# Patient Record
Sex: Male | Born: 1990 | Race: White | Hispanic: No | Marital: Single | Smoking: Never smoker
Health system: Southern US, Community
[De-identification: ages and names within clinical notes are randomized; demographics above are authoritative.]

---

## 2013-06-19 ENCOUNTER — Ambulatory Visit (INDEPENDENT_AMBULATORY_CARE_PROVIDER_SITE_OTHER): Payer: Commercial Managed Care - PPO | Admitting: Family Medicine

## 2013-06-19 ENCOUNTER — Encounter (INDEPENDENT_AMBULATORY_CARE_PROVIDER_SITE_OTHER): Payer: Self-pay

## 2013-06-19 VITALS — BP 125/75 | HR 86 | Temp 96.8°F | Ht 71.0 in | Wt 180.0 lb

## 2013-06-19 DIAGNOSIS — H60399 Other infective otitis externa, unspecified ear: Secondary | ICD-10-CM

## 2013-06-19 DIAGNOSIS — H60391 Other infective otitis externa, right ear: Secondary | ICD-10-CM

## 2013-06-19 MED ORDER — NEOMYCIN-POLYMYXIN-HC 3.5-10000-1 OT SOLN: 4.0000 [drp] | Freq: Four times a day (QID) | OTIC | Status: DC

## 2013-06-19 MED ORDER — AMOXICILLIN 875 MG PO TABS: 875.0000 mg | ORAL_TABLET | Freq: Two times a day (BID) | ORAL | Status: DC

## 2013-06-19 NOTE — Patient Instructions (Signed)
Otitis Externa Otitis externa is a bacterial or fungal infection of the outer ear canal. This is the area from the eardrum to the outside of the ear. Otitis externa is sometimes called "swimmer's ear." CAUSES  Possible causes of infection include:  Swimming in dirty water.  Moisture remaining in the ear after swimming or bathing.  Mild injury (trauma) to the ear.  Objects stuck in the ear (foreign body).  Cuts or scrapes (abrasions) on the outside of the ear. SYMPTOMS  The first symptom of infection is often itching in the ear canal. Later signs and symptoms may include swelling and redness of the ear canal, ear pain, and yellowish-white fluid (pus) coming from the ear. The ear pain may be worse when pulling on the earlobe. DIAGNOSIS  Your caregiver will perform a physical exam. A sample of fluid may be taken from the ear and examined for bacteria or fungi. TREATMENT  Antibiotic ear drops are often given for 10 to 14 days. Treatment may also include pain medicine or corticosteroids to reduce itching and swelling. PREVENTION   Keep your ear dry. Use the corner of a towel to absorb water out of the ear canal after swimming or bathing.  Avoid scratching or putting objects inside your ear. This can damage the ear canal or remove the protective wax that lines the canal. This makes it easier for bacteria and fungi to grow.  Avoid swimming in lakes, polluted water, or poorly chlorinated pools.  You may use ear drops made of rubbing alcohol and vinegar after swimming. Combine equal parts of white vinegar and alcohol in a bottle. Put 3 or 4 drops into each ear after swimming. HOME CARE INSTRUCTIONS   Apply antibiotic ear drops to the ear canal as prescribed by your caregiver.  Only take over-the-counter or prescription medicines for pain, discomfort, or fever as directed by your caregiver.  If you have diabetes, follow any additional treatment instructions from your caregiver.  Keep all  follow-up appointments as directed by your caregiver. SEEK MEDICAL CARE IF:   You have a fever.  Your ear is still red, swollen, painful, or draining pus after 3 days.  Your redness, swelling, or pain gets worse.  You have a severe headache.  You have redness, swelling, pain, or tenderness in the area behind your ear. MAKE SURE YOU:   Understand these instructions.  Will watch your condition.  Will get help right away if you are not doing well or get worse. Document Released: 07/19/2005 Document Revised: 10/11/2011 Document Reviewed: 08/05/2011 Exit[employer] Patient Information 2014 [employer].  

## 2013-06-19 NOTE — Progress Notes (Signed)
  Subjective:    Patient ID: Richard Shepard, male    DOB: [birth year], 22 y.o.   MRN: [id number]  HPI This 22 y.o. male presents for evaluation of right ear discomfort for 2 days.  He c/o decreased Hearing acuity right ear.   Review of Systems C/o right otalgia and decreased hearing acuity. No chest pain, SOB, HA, dizziness, vision change, N/V, diarrhea, constipation, dysuria, urinary urgency or frequency, myalgias, arthralgias or rash.     Objective:   Physical Exam Vital signs noted  Well developed well nourished male.  HEENT - Head atraumatic Normocephalic                Eyes - PERRLA, Conjuctiva - clear Sclera- Clear EOMI                Ears - EAC's bilateral cerumen impactions  Gross Hearing decreased Right ear.                           After irrigation of right ear the EAC is decreased and TM unable to be visualized.                Throat - oropharanx wnl Respiratory - Lungs CTA bilateral Cardiac - RRR S1 and S2 without murmur.  Attempted to remove wax from right ear with a wick and then had to stop due to discomfort and cerumen impaction remains.     Assessment & Plan:  Otitis, externa, infective, right - Plan: neomycin-polymyxin-hydrocortisone (CORTISPORIN) otic solution, amoxicillin (AMOXIL) 875 MG tablet po bid x 10 days.  Deatra Canter FNP

## 2013-06-20 ENCOUNTER — Ambulatory Visit: Payer: Self-pay | Admitting: Family Medicine

## 2013-10-11 ENCOUNTER — Encounter: Payer: Self-pay | Admitting: Nurse Practitioner

## 2013-10-11 ENCOUNTER — Ambulatory Visit (INDEPENDENT_AMBULATORY_CARE_PROVIDER_SITE_OTHER): Payer: Commercial Managed Care - PPO | Admitting: Nurse Practitioner

## 2013-10-11 VITALS — BP 113/70 | HR 96 | Temp 100.9°F | Ht 70.0 in | Wt 178.0 lb

## 2013-10-11 DIAGNOSIS — J101 Influenza due to other identified influenza virus with other respiratory manifestations: Secondary | ICD-10-CM

## 2013-10-11 DIAGNOSIS — J111 Influenza due to unidentified influenza virus with other respiratory manifestations: Secondary | ICD-10-CM

## 2013-10-11 DIAGNOSIS — J029 Acute pharyngitis, unspecified: Secondary | ICD-10-CM

## 2013-10-11 LAB — POCT RAPID STREP A (OFFICE): Rapid Strep A Screen: NEGATIVE

## 2013-10-11 LAB — POCT INFLUENZA A/B
INFLUENZA A, POC: POSITIVE
Influenza B, POC: NEGATIVE

## 2013-10-11 MED ORDER — OSELTAMIVIR PHOSPHATE 75 MG PO CAPS: 75.0000 mg | ORAL_CAPSULE | Freq: Every day | ORAL | Status: DC

## 2013-10-11 NOTE — Patient Instructions (Signed)

## 2013-10-11 NOTE — Progress Notes (Signed)
   Subjective:    Patient ID: Richard Shepard, male    DOB: March 18, 1991, 23 y.o.   MRN: [id number]  HPI  Patient presents today complaining of cough, fever, body aches, chills, sore throat, headache, fatigue, and sinus pressure x 3 days. Patient has not taken any medications for his symptoms. Denies any sick contacts.   Review of Systems  Constitutional: Positive for fever, chills and fatigue.  HENT: Positive for congestion, ear pain, rhinorrhea, sinus pressure and sore throat.   Respiratory: Positive for cough. Negative for shortness of breath.   Cardiovascular: Negative for chest pain.  All other systems reviewed and are negative.       Objective:   Physical Exam  Constitutional: He is oriented to person, place, and time. He appears well-developed and well-nourished.  HENT:  Head: Normocephalic.  Right Ear: External ear normal.  Left Ear: External ear normal.  Nose: Right sinus exhibits maxillary sinus tenderness. Left sinus exhibits maxillary sinus tenderness.  Mouth/Throat: Posterior oropharyngeal erythema present.  Neck: Normal range of motion. Neck supple.  Cardiovascular: Normal rate, regular rhythm and normal heart sounds.   Pulmonary/Chest: Effort normal and breath sounds normal. He has no wheezes.  Lymphadenopathy:    He has no cervical adenopathy.  Neurological: He is alert and oriented to person, place, and time.  Skin: Skin is warm and dry.  Psychiatric: He has a normal mood and affect. His behavior is normal. Judgment and thought content normal.    BP 113/70  Pulse 96  Temp(Src) 100.9 F (38.3 C) (Oral)  Ht 5\' 10"  (1.778 m)  Wt 178 lb (80.74 kg)  BMI 25.54 kg/m2  Results for orders placed in visit on 10/11/13  POCT INFLUENZA A/B      Result Value Ref Range   Influenza A, POC Positive     Influenza B, POC Negative          Assessment & Plan:   1. Sore throat   2. Influenza A    Orders Placed This Encounter  Procedures  . POCT Influenza A/B  .  POCT rapid strep A   Meds ordered this encounter  Medications  . oseltamivir (TAMIFLU) 75 MG capsule    Sig: Take 1 capsule (75 mg total) by mouth daily.    Dispense:  10 capsule    Refill:  0    Order Specific Question:  Supervising Provider    Answer:  Ernestina PennaMOORE, DONALD W [[id number]]   Rest Push fluids Tylenol as needed for fever and body aches  Mary-Margaret Daphine DeutscherMartin, FNP

## 2014-03-18 ENCOUNTER — Encounter: Payer: Self-pay | Admitting: Family

## 2014-03-18 ENCOUNTER — Ambulatory Visit (INDEPENDENT_AMBULATORY_CARE_PROVIDER_SITE_OTHER): Payer: Commercial Managed Care - PPO | Admitting: Family

## 2014-03-18 VITALS — BP 120/60 | HR 85 | Temp 97.8°F | Ht 70.5 in | Wt 171.0 lb

## 2014-03-18 DIAGNOSIS — H6123 Impacted cerumen, bilateral: Secondary | ICD-10-CM

## 2014-03-18 DIAGNOSIS — H6120 Impacted cerumen, unspecified ear: Secondary | ICD-10-CM

## 2014-03-18 NOTE — Patient Instructions (Signed)
Cerumen Impaction °A cerumen impaction is when the wax in your ear forms a plug. This plug usually causes reduced hearing. Sometimes it also causes an earache or dizziness. Removing a cerumen impaction can be difficult and painful. The wax sticks to the ear canal. The canal is sensitive and bleeds easily. If you try to remove a heavy wax buildup with a cotton tipped swab, you may push it in further. °Irrigation with water, suction, and small ear curettes may be used to clear out the wax. If the impaction is fixed to the skin in the ear canal, ear drops may be needed for a few days to loosen the wax. People who build up a lot of wax frequently can use ear wax removal products available in your local drugstore. °SEEK MEDICAL CARE IF:  °You develop an earache, increased hearing loss, or marked dizziness. °Document Released: 08/26/2004 Document Revised: 10/11/2011 Document Reviewed: 10/16/2009 °ExitCare® Patient Information ©2015 [employer]. This information is not intended to replace advice given to you by your health care provider. Make sure you discuss any questions you have with your health care provider. ° °

## 2014-03-18 NOTE — Progress Notes (Signed)
   Subjective:    Patient ID: Richard Shepard, male    DOB: 06-29-1991, 23 y.o.   MRN: [id number]  Ear Fullness  There is pain in the left ear. This is a new problem. The current episode started in the past 7 days. The problem occurs constantly. The problem has been gradually worsening. There has been no fever. The pain is at a severity of 2/10. The pain is mild. Associated symptoms include hearing loss. Pertinent negatives include no abdominal pain, coughing, diarrhea, ear discharge, headaches, neck pain, rhinorrhea, sore throat or vomiting. He has tried ear drops for the symptoms. The treatment provided mild relief.      Review of Systems  Constitutional: Negative.   HENT: Positive for hearing loss. Negative for ear discharge, rhinorrhea and sore throat.   Respiratory: Negative.  Negative for cough.   Cardiovascular: Negative.   Gastrointestinal: Negative.  Negative for vomiting, abdominal pain and diarrhea.  Endocrine: Negative.   Genitourinary: Negative.   Musculoskeletal: Negative.  Negative for neck pain.  Neurological: Negative.  Negative for headaches.  Hematological: Negative.   Psychiatric/Behavioral: Negative.   All other systems reviewed and are negative.      Objective:   Physical Exam  Vitals reviewed. Constitutional: He is oriented to person, place, and time. He appears well-developed and well-nourished. No distress.  HENT:  Head: Normocephalic.  Right Ear: External ear normal.  Left Ear: External ear normal.  Nose: Nose normal.  Mouth/Throat: Oropharynx is clear and moist.  Eyes: Pupils are equal, round, and reactive to light. Right eye exhibits no discharge. Left eye exhibits no discharge.  Neck: Normal range of motion. Neck supple. No thyromegaly present.  Cardiovascular: Normal rate, regular rhythm, normal heart sounds and intact distal pulses.   No murmur heard. Pulmonary/Chest: Effort normal and breath sounds normal. No respiratory distress. He has no  wheezes.  Abdominal: Soft. Bowel sounds are normal. He exhibits no distension. There is no tenderness.  Musculoskeletal: Normal range of motion. He exhibits no edema and no tenderness.  Neurological: He is alert and oriented to person, place, and time. He has normal reflexes. No cranial nerve deficit.  Skin: Skin is warm and dry. No rash noted. No erythema.  Psychiatric: He has a normal mood and affect. His behavior is normal. Judgment and thought content normal.      BP 120/60  Pulse 85  Temp(Src) 97.8 F (36.6 C) (Oral)  Ht 5' 10.5" (1.791 m)  Wt 171 lb (77.565 kg)  BMI 24.18 kg/m2     Assessment & Plan:  1. Cerumen impaction, bilateral -Keep ears clean and dry -Do not place anything into ears -RTO if ear fullness or pain returns  Jannifer Rodneyhristy Artice Bergerson, FNP

## 2014-07-12 ENCOUNTER — Ambulatory Visit (INDEPENDENT_AMBULATORY_CARE_PROVIDER_SITE_OTHER): Payer: Commercial Managed Care - PPO | Admitting: Family Medicine

## 2014-07-12 ENCOUNTER — Encounter: Payer: Self-pay | Admitting: Family Medicine

## 2014-07-12 VITALS — BP 103/71 | HR 97 | Temp 98.8°F | Ht 70.5 in | Wt 176.4 lb

## 2014-07-12 DIAGNOSIS — J206 Acute bronchitis due to rhinovirus: Secondary | ICD-10-CM

## 2014-07-12 MED ORDER — ALBUTEROL SULFATE HFA 108 (90 BASE) MCG/ACT IN AERS: 2.0000 | INHALATION_SPRAY | Freq: Four times a day (QID) | RESPIRATORY_TRACT | Status: DC | PRN

## 2014-07-12 MED ORDER — AZITHROMYCIN 250 MG PO TABS
ORAL_TABLET | ORAL | Status: DC
Start: 2014-07-12 — End: 2015-09-22

## 2014-07-12 MED ORDER — HYDROCODONE-HOMATROPINE 5-1.5 MG/5ML PO SYRP: 5.0000 mL | ORAL_SOLUTION | Freq: Three times a day (TID) | ORAL | Status: DC | PRN

## 2014-07-12 MED ORDER — METHYLPREDNISOLONE ACETATE 80 MG/ML IJ SUSP
80.0000 mg | Freq: Once | INTRAMUSCULAR | Status: AC
  Administered 2014-07-12: 80 mg via INTRAMUSCULAR

## 2014-07-12 NOTE — Progress Notes (Signed)
   Subjective:    Patient ID: Rupert StacksJoseph M Turpin, male    DOB: 1990/09/26, 23 y.o.   MRN: [id number]  HPI C/o cough and uri sx's  Review of Systems  Constitutional: Positive for fatigue. Negative for fever.  HENT: Negative for ear pain.   Eyes: Negative for discharge.  Respiratory: Positive for cough.   Cardiovascular: Negative for chest pain.  Gastrointestinal: Negative for abdominal distention.  Endocrine: Negative for polyuria.  Genitourinary: Negative for difficulty urinating.  Musculoskeletal: Negative for gait problem and neck pain.  Skin: Negative for color change and rash.  Neurological: Negative for speech difficulty and headaches.  Psychiatric/Behavioral: Negative for agitation.       Objective:    BP 103/71 mmHg  Pulse 97  Temp(Src) 98.8 F (37.1 C) (Oral)  Ht 5' 10.5" (1.791 m)  Wt 176 lb 6.4 oz (80.015 kg)  BMI 24.94 kg/m2 Physical Exam  Constitutional: He is oriented to person, place, and time. He appears well-developed and well-nourished.  HENT:  Head: Normocephalic and atraumatic.  Mouth/Throat: Oropharynx is clear and moist.  Eyes: Pupils are equal, round, and reactive to light.  Neck: Normal range of motion. Neck supple.  Cardiovascular: Normal rate and regular rhythm.   No murmur heard. Pulmonary/Chest: Effort normal. He has wheezes.  Abdominal: Soft. Bowel sounds are normal. There is no tenderness.  Neurological: He is alert and oriented to person, place, and time.  Skin: Skin is warm and dry.  Psychiatric: He has a normal mood and affect.          Assessment & Plan:     ICD-9-CM ICD-10-CM   1. Acute bronchitis due to Rhinovirus 466.0 J20.6 azithromycin (ZITHROMAX) 250 MG tablet   079.3  methylPREDNISolone acetate (DEPO-MEDROL) injection 80 mg     HYDROcodone-homatropine (HYCODAN) 5-1.5 MG/5ML syrup     albuterol (PROVENTIL HFA;VENTOLIN HFA) 108 (90 BASE) MCG/ACT inhaler     Return if symptoms worsen or fail to improve.  Deatra CanterWilliam J Jourdan Maldonado  FNP

## 2015-09-22 ENCOUNTER — Encounter: Payer: Self-pay | Admitting: Family Medicine

## 2015-09-22 ENCOUNTER — Ambulatory Visit (INDEPENDENT_AMBULATORY_CARE_PROVIDER_SITE_OTHER): Payer: BLUE CROSS/BLUE SHIELD | Admitting: Family Medicine

## 2015-09-22 VITALS — BP 138/83 | HR 106 | Temp 97.7°F | Ht 70.5 in | Wt 180.0 lb

## 2015-09-22 DIAGNOSIS — J209 Acute bronchitis, unspecified: Secondary | ICD-10-CM

## 2015-09-22 MED ORDER — AZITHROMYCIN 250 MG PO TABS: ORAL_TABLET | ORAL | Status: DC

## 2015-09-22 MED ORDER — ALBUTEROL SULFATE HFA 108 (90 BASE) MCG/ACT IN AERS: 2.0000 | INHALATION_SPRAY | Freq: Four times a day (QID) | RESPIRATORY_TRACT | Status: DC | PRN

## 2015-09-22 MED ORDER — BENZONATATE 200 MG PO CAPS: 200.0000 mg | ORAL_CAPSULE | Freq: Two times a day (BID) | ORAL | Status: DC | PRN

## 2015-09-22 MED ORDER — FLUTICASONE PROPIONATE 50 MCG/ACT NA SUSP: 1.0000 | Freq: Two times a day (BID) | NASAL | Status: DC | PRN

## 2015-09-22 NOTE — Progress Notes (Signed)
BP 138/83 mmHg  Pulse 106  Temp(Src) 97.7 F (36.5 C) (Oral)  Ht 5' 10.5" (1.791 m)  Wt 180 lb (81.647 kg)  BMI 25.45 kg/m2   Subjective:    Patient ID: Richard Shepard, male    DOB: June 18, 1991, 25 y.o.   MRN: [id number]  HPI: Richard Shepard is a 25 y.o. male presenting on 09/22/2015 for Cough   Cough Associated symptoms include postnasal drip, rhinorrhea and a sore throat. Pertinent negatives include no chest pain, chills, ear pain, eye redness, fever, headaches, rash, shortness of breath or wheezing.   Cough and congestion Patient comes in today complaining of cough and congestion and sinus pressure and postnasal drainage. His cough is mostly dry. He has tried cough drops and Coricidin spray which have not seemed to help much with cough. He denies any fevers or chills or shortness of breath or wheezing. He denies any sick contacts that he knows of.  Relevant past medical, surgical, family and social history reviewed and updated as indicated. Interim medical history since our last visit reviewed. Allergies and medications reviewed and updated.  Review of Systems  Constitutional: Negative for fever and chills.  HENT: Positive for congestion, postnasal drip, rhinorrhea, sinus pressure, sneezing and sore throat. Negative for ear discharge, ear pain and voice change.   Eyes: Negative for pain, discharge, redness and visual disturbance.  Respiratory: Positive for cough. Negative for chest tightness, shortness of breath and wheezing.   Cardiovascular: Negative for chest pain and leg swelling.  Gastrointestinal: Negative for abdominal pain, diarrhea and constipation.  Genitourinary: Negative for difficulty urinating.  Musculoskeletal: Negative for back pain and gait problem.  Skin: Negative for rash.  Neurological: Negative for syncope, light-headedness and headaches.  All other systems reviewed and are negative.   Per HPI unless specifically indicated above     Medication List         This list is accurate as of: 09/22/15 11:10 AM.  Always use your most recent med list.               albuterol 108 (90 Base) MCG/ACT inhaler  Commonly known as:  PROVENTIL HFA;VENTOLIN HFA  Inhale 2 puffs into the lungs every 6 (six) hours as needed for wheezing or shortness of breath.     azithromycin 250 MG tablet  Commonly known as:  ZITHROMAX  Take 2 the first day and then one each day after.     benzonatate 200 MG capsule  Commonly known as:  TESSALON  Take 1 capsule (200 mg total) by mouth 2 (two) times daily as needed for cough.     fluticasone 50 MCG/ACT nasal spray  Commonly known as:  FLONASE  Place 1 spray into both nostrils 2 (two) times daily as needed for allergies or rhinitis.           Objective:    BP 138/83 mmHg  Pulse 106  Temp(Src) 97.7 F (36.5 C) (Oral)  Ht 5' 10.5" (1.791 m)  Wt 180 lb (81.647 kg)  BMI 25.45 kg/m2  Wt Readings from Last 3 Encounters:  09/22/15 180 lb (81.647 kg)  07/12/14 176 lb 6.4 oz (80.015 kg)  03/18/14 171 lb (77.565 kg)    Physical Exam  Constitutional: He is oriented to person, place, and time. He appears well-developed and well-nourished. No distress.  HENT:  Right Ear: Tympanic membrane, external ear and ear canal normal.  Left Ear: Tympanic membrane, external ear and ear canal normal.  Nose: Mucosal edema  and rhinorrhea present. No sinus tenderness. No epistaxis. Right sinus exhibits maxillary sinus tenderness. Right sinus exhibits no frontal sinus tenderness. Left sinus exhibits maxillary sinus tenderness. Left sinus exhibits no frontal sinus tenderness.  Mouth/Throat: Uvula is midline and mucous membranes are normal. Posterior oropharyngeal edema and posterior oropharyngeal erythema present. No oropharyngeal exudate or tonsillar abscesses.  Eyes: Conjunctivae and EOM are normal. Pupils are equal, round, and reactive to light. Right eye exhibits no discharge. No scleral icterus.  Neck: Neck supple. No  thyromegaly present.  Cardiovascular: Normal rate, regular rhythm, normal heart sounds and intact distal pulses.   No murmur heard. Pulmonary/Chest: Effort normal and breath sounds normal. No respiratory distress. He has no wheezes.  Musculoskeletal: Normal range of motion. He exhibits no edema.  Lymphadenopathy:    He has no cervical adenopathy.  Neurological: He is alert and oriented to person, place, and time. Coordination normal.  Skin: Skin is warm and dry. No rash noted. He is not diaphoretic.  Psychiatric: He has a normal mood and affect. His behavior is normal.  Nursing note and vitals reviewed.     Assessment & Plan:   Problem List Items Addressed This Visit    None    Visit Diagnoses    Acute bronchitis, unspecified organism    -  Primary    Relevant Medications    albuterol (PROVENTIL HFA;VENTOLIN HFA) 108 (90 Base) MCG/ACT inhaler    fluticasone (FLONASE) 50 MCG/ACT nasal spray    azithromycin (ZITHROMAX) 250 MG tablet    benzonatate (TESSALON) 200 MG capsule       Follow up plan: Return if symptoms worsen or fail to improve.  Counseling provided for all of the vaccine components No orders of the defined types were placed in this encounter.    Arville Care, MD [facility] 09/22/2015, 11:10 AM

## 2016-12-11 ENCOUNTER — Encounter: Payer: Self-pay | Admitting: Nurse Practitioner

## 2016-12-11 ENCOUNTER — Ambulatory Visit (INDEPENDENT_AMBULATORY_CARE_PROVIDER_SITE_OTHER): Payer: BLUE CROSS/BLUE SHIELD | Admitting: Nurse Practitioner

## 2016-12-11 VITALS — BP 110/75 | HR 81 | Temp 97.1°F | Resp 18 | Ht 70.5 in | Wt 170.2 lb

## 2016-12-11 DIAGNOSIS — J209 Acute bronchitis, unspecified: Secondary | ICD-10-CM | POA: Diagnosis not present

## 2016-12-11 MED ORDER — PREDNISONE 20 MG PO TABS: ORAL_TABLET | ORAL | 0 refills | Status: DC

## 2016-12-11 MED ORDER — BENZONATATE 100 MG PO CAPS: 100.0000 mg | ORAL_CAPSULE | Freq: Three times a day (TID) | ORAL | 0 refills | Status: DC | PRN

## 2016-12-11 MED ORDER — HYDROCODONE-HOMATROPINE 5-1.5 MG/5ML PO SYRP: 5.0000 mL | ORAL_SOLUTION | Freq: Four times a day (QID) | ORAL | 0 refills | Status: DC | PRN

## 2016-12-11 MED ORDER — AZITHROMYCIN 250 MG PO TABS: ORAL_TABLET | ORAL | 0 refills | Status: DC

## 2016-12-11 NOTE — Patient Instructions (Signed)

## 2016-12-11 NOTE — Progress Notes (Signed)
Subjective:    Patient ID: Richard Shepard, male    DOB: July 27, 1991, 26 y.o.   MRN: [id number]  HPI Patient comes inc/o cough that started 2 weeks. Chest is sore from coughing. Cough has become productive and is yellowish green in color. Denies fever.    Review of Systems  Constitutional: Positive for chills. Negative for fever.  HENT: Positive for congestion, rhinorrhea and sore throat. Negative for ear pain, sinus pain, sinus pressure and trouble swallowing.   Eyes: Negative.   Respiratory: Positive for cough. Negative for shortness of breath.   Cardiovascular: Negative.   Gastrointestinal: Negative.   Genitourinary: Negative.   Neurological: Negative.   Psychiatric/Behavioral: Negative.   All other systems reviewed and are negative.      Objective:   Physical Exam  Constitutional: He is oriented to person, place, and time. He appears well-developed and well-nourished. He appears distressed (mild).  HENT:  Right Ear: Hearing, tympanic membrane, external ear and ear canal normal.  Left Ear: Hearing, tympanic membrane, external ear and ear canal normal.  Nose: Mucosal edema and rhinorrhea present. Right sinus exhibits no maxillary sinus tenderness and no frontal sinus tenderness. Left sinus exhibits no maxillary sinus tenderness and no frontal sinus tenderness.  Mouth/Throat: Uvula is midline, oropharynx is clear and moist and mucous membranes are normal.  Neck: Normal range of motion. Neck supple.  Cardiovascular: Normal rate, regular rhythm and normal heart sounds.   Pulmonary/Chest: Effort normal.  Rhonchi through lung files Deep cough  Abdominal: Soft. Bowel sounds are normal.  Lymphadenopathy:    He has no cervical adenopathy.  Neurological: He is alert and oriented to person, place, and time.  Skin: Skin is warm.  Psychiatric: He has a normal mood and affect. His behavior is normal. Judgment and thought content normal.   BP 110/75   Pulse 81   Temp 97.1 F (36.2 C)  (Oral)   Resp 18   Ht 5' 10.5" (1.791 m)   Wt 170 lb 3.2 oz (77.2 kg)   SpO2 100%   BMI 24.08 kg/m      Assessment & Plan:   1. Acute bronchitis, unspecified organism    Meds ordered this encounter  Medications  . azithromycin (ZITHROMAX) 250 MG tablet    Sig: Take 2 the first day and then one each day after.    Dispense:  6 tablet    Refill:  0    Order Specific Question:   Supervising Provider    Answer:   Stacie GlazeJENKINS, JOHN E [[id number]]  . HYDROcodone-homatropine (HYCODAN) 5-1.5 MG/5ML syrup    Sig: Take 5 mLs by mouth every 6 (six) hours as needed for cough.    Dispense:  120 mL    Refill:  0    Order Specific Question:   Supervising Provider    Answer:   Stacie GlazeJENKINS, JOHN E [[id number]]  . benzonatate (TESSALON PERLES) 100 MG capsule    Sig: Take 1 capsule (100 mg total) by mouth 3 (three) times daily as needed for cough.    Dispense:  20 capsule    Refill:  0    Order Specific Question:   Supervising Provider    Answer:   Stacie GlazeJENKINS, JOHN E [[id number]]  . predniSONE (DELTASONE) 20 MG tablet    Sig: 2 po at sametime daily for 5 days    Dispense:  10 tablet    Refill:  0    Order Specific Question:   Supervising Provider    Answer:  JENKINS, JOHN E [[id number]]   1. Take meds as prescribed 2. Use a cool mist humidifier especially during the winter months and when heat has been humid. 3. Use saline nose sprays frequently 4. Saline irrigations of the nose can be very helpful if done frequently.  * 4X daily for 1 week*  * Use of a nettie pot can be helpful with this. Follow directions with this* 5. Drink plenty of fluids 6. Keep thermostat turn down low 7.For any cough or congestion  Use plain Mucinex- regular strength or max strength is fine   * Children- consult with Pharmacist for dosing 8. For fever or aces or pains- take tylenol or ibuprofen appropriate for age and weight.  * for fevers greater than 101 orally you may alternate ibuprofen and tylenol every  3 hours.   Mary-Margaret Daphine Deutscher,  FNP

## 2018-07-24 ENCOUNTER — Ambulatory Visit (INDEPENDENT_AMBULATORY_CARE_PROVIDER_SITE_OTHER): Payer: Self-pay | Admitting: Family

## 2018-07-24 ENCOUNTER — Ambulatory Visit (INDEPENDENT_AMBULATORY_CARE_PROVIDER_SITE_OTHER): Payer: Self-pay

## 2018-07-24 ENCOUNTER — Encounter: Payer: Self-pay | Admitting: Family

## 2018-07-24 VITALS — BP 119/74 | HR 81 | Temp 99.1°F | Ht 70.5 in | Wt 194.0 lb

## 2018-07-24 DIAGNOSIS — L089 Local infection of the skin and subcutaneous tissue, unspecified: Secondary | ICD-10-CM

## 2018-07-24 DIAGNOSIS — L03011 Cellulitis of right finger: Secondary | ICD-10-CM

## 2018-07-24 DIAGNOSIS — M7989 Other specified soft tissue disorders: Secondary | ICD-10-CM

## 2018-07-24 DIAGNOSIS — T23121A Burn of first degree of single right finger (nail) except thumb, initial encounter: Secondary | ICD-10-CM

## 2018-07-24 MED ORDER — CLINDAMYCIN HCL 300 MG PO CAPS: 300.0000 mg | ORAL_CAPSULE | Freq: Four times a day (QID) | ORAL | 0 refills | Status: DC

## 2018-07-24 MED ORDER — CEFTRIAXONE SODIUM 1 G IJ SOLR
1.0000 g | Freq: Once | INTRAMUSCULAR | Status: AC
  Administered 2018-07-24: 1 g via INTRAMUSCULAR

## 2018-07-24 NOTE — Patient Instructions (Signed)

## 2018-07-24 NOTE — Progress Notes (Signed)
Subjective:    Patient ID: Richard Shepard, male    DOB: 04-17-1991, 27 y.o.   MRN: [id number]  Chief Complaint  Patient presents with  . Hand Pain    right index finger burn    Burn  The incident occurred 3 to 5 days ago. Incident location: on frying pan. The burns were a result of contact with a hot surface. Burn location: right index finger. The pain is at a severity of 8/10. The pain is mild. He has tried soaking the burn and running the burn under water for the symptoms. The treatment provided mild relief.      Review of Systems  Skin: Positive for wound.  All other systems reviewed and are negative.      Objective:   Physical Exam Vitals signs reviewed.  Constitutional:      General: He is not in acute distress.    Appearance: He is well-developed.  Neck:     Musculoskeletal: Normal range of motion and neck supple.     Thyroid: No thyromegaly.  Cardiovascular:     Rate and Rhythm: Normal rate and regular rhythm.     Heart sounds: Normal heart sounds. No murmur.  Pulmonary:     Effort: Pulmonary effort is normal. No respiratory distress.     Breath sounds: Normal breath sounds. No wheezing.  Abdominal:     General: Bowel sounds are normal. There is no distension.     Palpations: Abdomen is soft.     Tenderness: There is no abdominal tenderness.  Musculoskeletal: Normal range of motion.        General: Swelling (3+ swelling, redness, and tenderness of right index finger) present. No tenderness.  Skin:    General: Skin is warm and dry.     Findings: No erythema or rash.  Neurological:     Mental Status: He is alert and oriented to person, place, and time.     Cranial Nerves: No cranial nerve deficit.     Deep Tendon Reflexes: Reflexes are normal and symmetric.  Psychiatric:        Thought Content: Thought content normal.        Judgment: Judgment normal.         BP 119/74   Pulse 81   Temp 99.1 F (37.3 C)   Ht 5' 10.5" (1.791 m)   Wt 194 lb (88  kg)   BMI 27.44 kg/m      Assessment & Plan:  Richard Shepard comes in today with chief complaint of Hand Pain (right index finger burn)   Diagnosis and orders addressed:  1. Superficial burn of right index finger - DG Finger Index Right; Future - clindamycin (CLEOCIN) 300 MG capsule; Take 1 capsule (300 mg total) by mouth 4 (four) times daily.  Dispense: 40 capsule; Refill: 0  2. Swelling of right index finger - DG Finger Index Right; Future - clindamycin (CLEOCIN) 300 MG capsule; Take 1 capsule (300 mg total) by mouth 4 (four) times daily.  Dispense: 40 capsule; Refill: 0  3. Infection of index finger - DG Finger Index Right; Future - clindamycin (CLEOCIN) 300 MG capsule; Take 1 capsule (300 mg total) by mouth 4 (four) times daily.  Dispense: 40 capsule; Refill: 0  4. Cellulitis of right finger - cefTRIAXone (ROCEPHIN) injection 1 g - clindamycin (CLEOCIN) 300 MG capsule; Take 1 capsule (300 mg total) by mouth 4 (four) times daily.  Dispense: 40 capsule; Refill: 0   Keep finger elevated  Strict precautions on going to ED if finger worsen since we will be closed.  Area marked Will start Clindamycin today RTO on Thursday or if symptoms worsen he will call or go to ED  Jannifer Rodneyhristy Hawks, FNP

## 2018-07-27 ENCOUNTER — Ambulatory Visit: Payer: Self-pay | Admitting: Family

## 2018-07-27 ENCOUNTER — Encounter: Payer: Self-pay | Admitting: Family

## 2018-07-27 VITALS — BP 124/75 | HR 80 | Temp 98.0°F | Ht 70.5 in | Wt 194.2 lb

## 2018-07-27 DIAGNOSIS — L03011 Cellulitis of right finger: Secondary | ICD-10-CM

## 2018-07-27 MED ORDER — CEFTRIAXONE SODIUM 1 G IJ SOLR
1.0000 g | Freq: Once | INTRAMUSCULAR | Status: AC
  Administered 2018-07-27: 1 g via INTRAMUSCULAR

## 2018-07-27 NOTE — Patient Instructions (Signed)

## 2018-07-27 NOTE — Progress Notes (Signed)
   Subjective:    Patient ID: Richard StacksJoseph M Bayon, male    DOB: [birth year], 27 y.o.   MRN: [id number]  Chief Complaint  Patient presents with  . recheck finger   PT presents to the office today to recheck cellulitis of right index finger. Pt burnt his finger about 5 days ago while cooking bacon. It became very red, swollen, and tender. He was seen on 07/24/18 and given Rocephin IM and started on Clindamycin. He reports the pain, redness, and swelling have slightly improve.  Wound Check  He was originally treated 3 to 5 days ago. Previous treatment included burn dressing. His temperature was unmeasured prior to arrival. There has been colored discharge from the wound. The redness has improved (slightly). The swelling has improved (slightly). The pain has improved. There is difficulty moving the extremity or digit due to pain (swelling).      Review of Systems  Skin: Positive for wound.  All other systems reviewed and are negative.      Objective:   Physical Exam Vitals signs reviewed.  Constitutional:      General: He is not in acute distress.    Appearance: He is well-developed.  HENT:     Head: Normocephalic.  Eyes:     General:        Right eye: No discharge.        Left eye: No discharge.     Pupils: Pupils are equal, round, and reactive to light.  Neck:     Musculoskeletal: Normal range of motion and neck supple.     Thyroid: No thyromegaly.  Cardiovascular:     Rate and Rhythm: Normal rate and regular rhythm.     Heart sounds: Normal heart sounds. No murmur.  Pulmonary:     Effort: Pulmonary effort is normal. No respiratory distress.     Breath sounds: Normal breath sounds. No wheezing.  Abdominal:     General: Bowel sounds are normal. There is no distension.     Palpations: Abdomen is soft.     Tenderness: There is no abdominal tenderness.  Musculoskeletal:        General: Swelling and tenderness present.     Comments: Right index finger with moderate swelling, and  purulent discharge. Tenderness present  Skin:    Findings: No erythema or rash.  Neurological:     Mental Status: He is alert and oriented to person, place, and time.     Cranial Nerves: No cranial nerve deficit.     Deep Tendon Reflexes: Reflexes are normal and symmetric.  Psychiatric:        Behavior: Behavior normal.        Thought Content: Thought content normal.        Judgment: Judgment normal.         BP 124/75   Pulse 80   Temp 98 F (36.7 C) (Oral)   Ht 5' 10.5" (1.791 m)   Wt 194 lb 3.2 oz (88.1 kg)   BMI 27.47 kg/m      Assessment & Plan:  Richard StacksJoseph M Mcroy comes in today with chief complaint of recheck finger   Diagnosis and orders addressed:  1. Cellulitis of right index finger Continue Clindamycin and will give another rocephin  Area marked, RTO after clindamycin or sooner if swelling, pain, fever, or discharge worsen Let area drain - cefTRIAXone (ROCEPHIN) injection 1 g   Jannifer Rodneyhristy Hawks, FNP

## 2018-08-04 ENCOUNTER — Telehealth: Payer: Self-pay | Admitting: Family

## 2018-08-04 NOTE — Telephone Encounter (Signed)
Pt scheduled with Christy 1/7 at 11:55 to recheck cellulitis of finger.

## 2018-08-08 ENCOUNTER — Ambulatory Visit: Payer: Self-pay | Admitting: Family

## 2020-05-22 IMAGING — DX DG FINGER INDEX 2+V*R*
3 series · 3 of 3 positions shown · non-contrast
Comparison: None.

CLINICAL DATA: Grease burn

EXAM:
RIGHT INDEX FINGER 2+V

[finger ap]
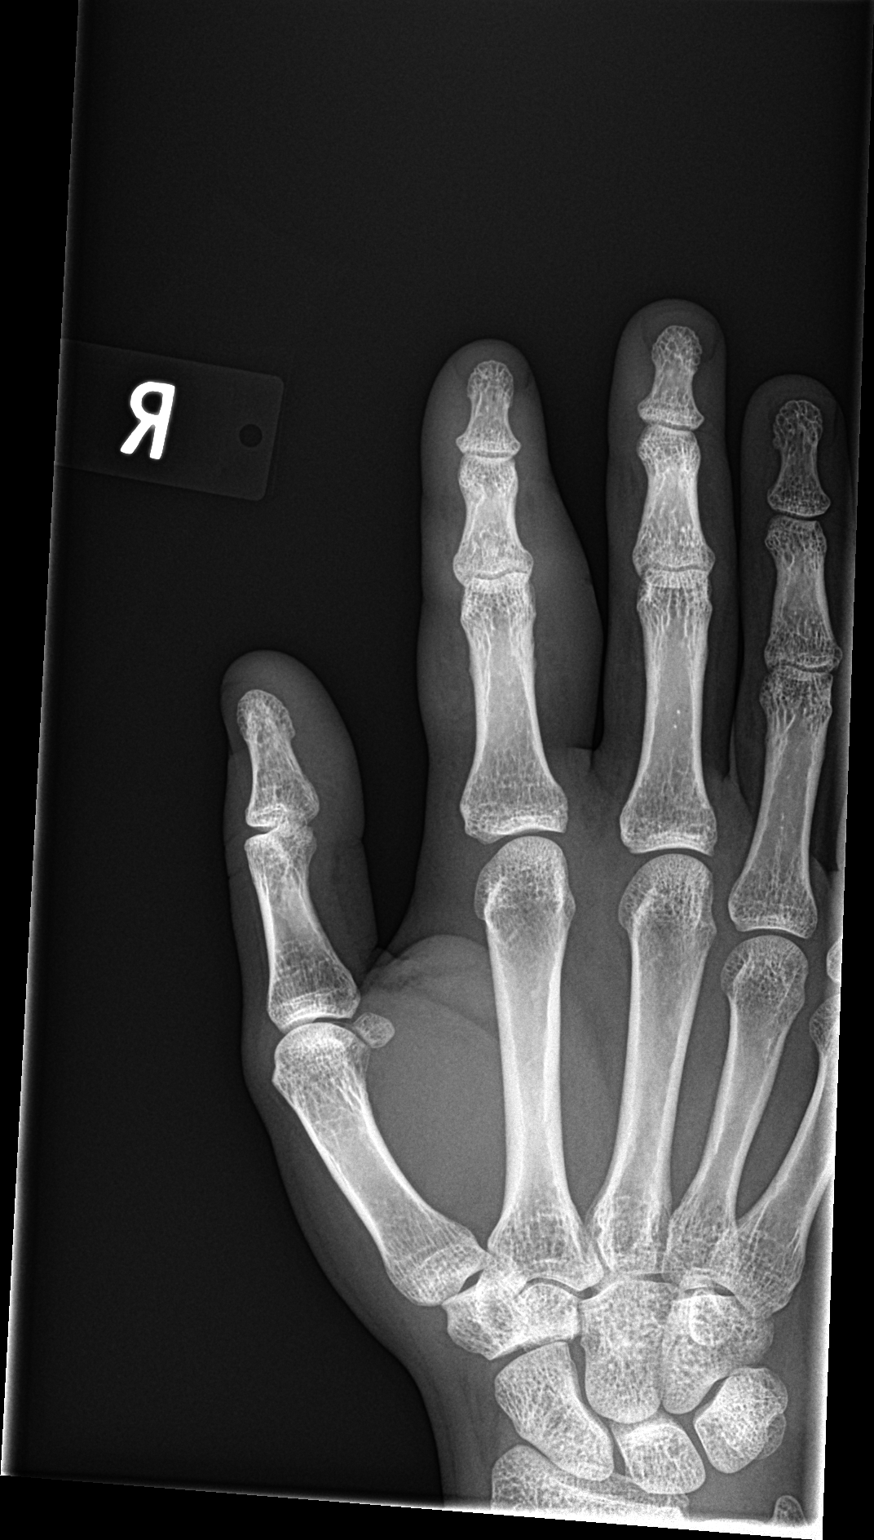

[finger obl]
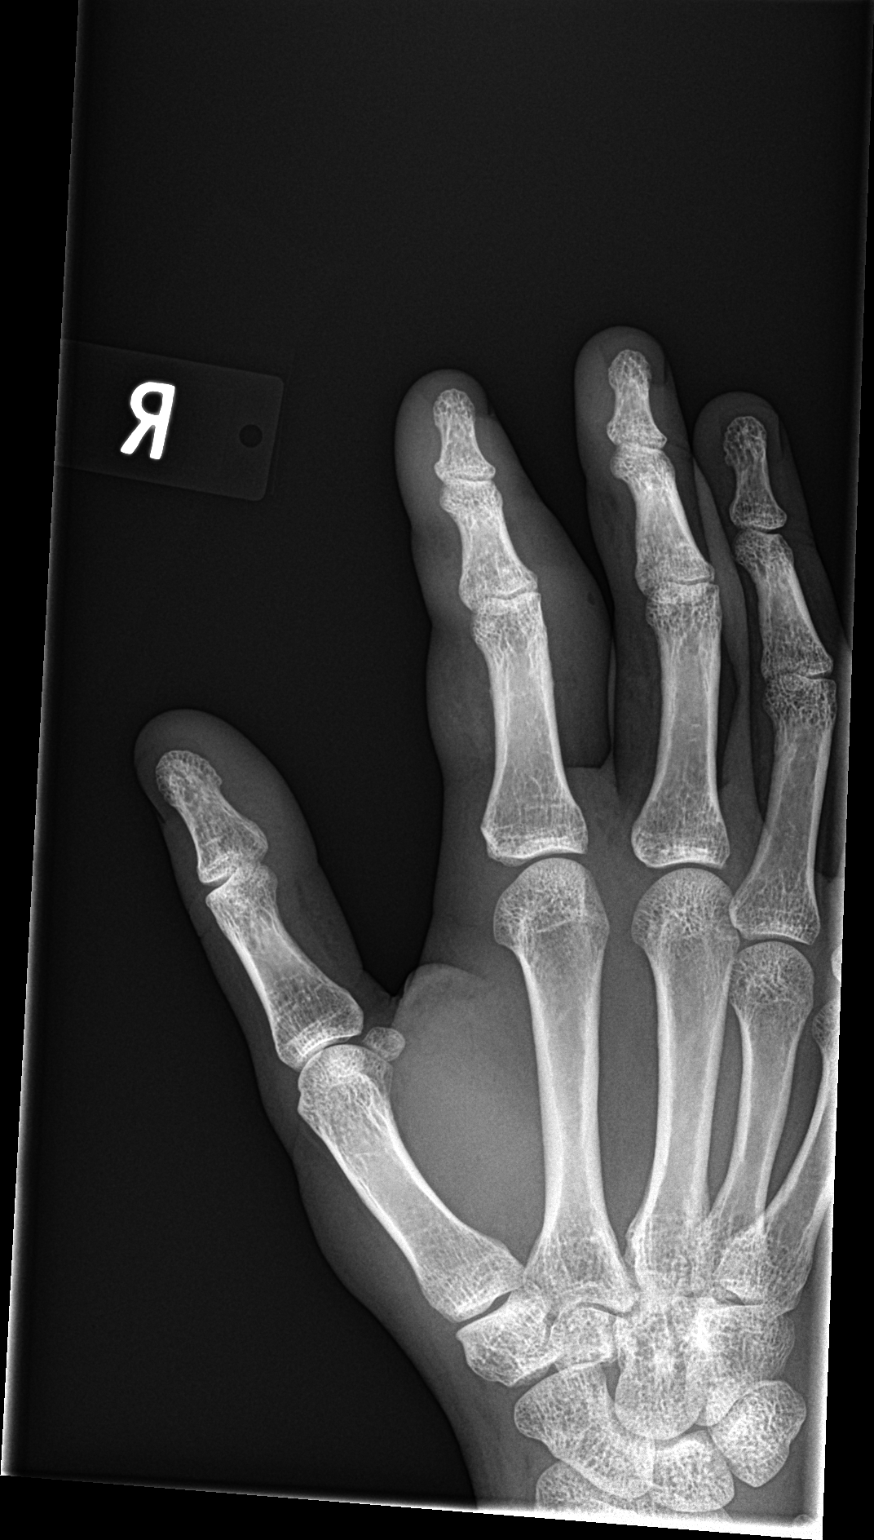

[finger lat]
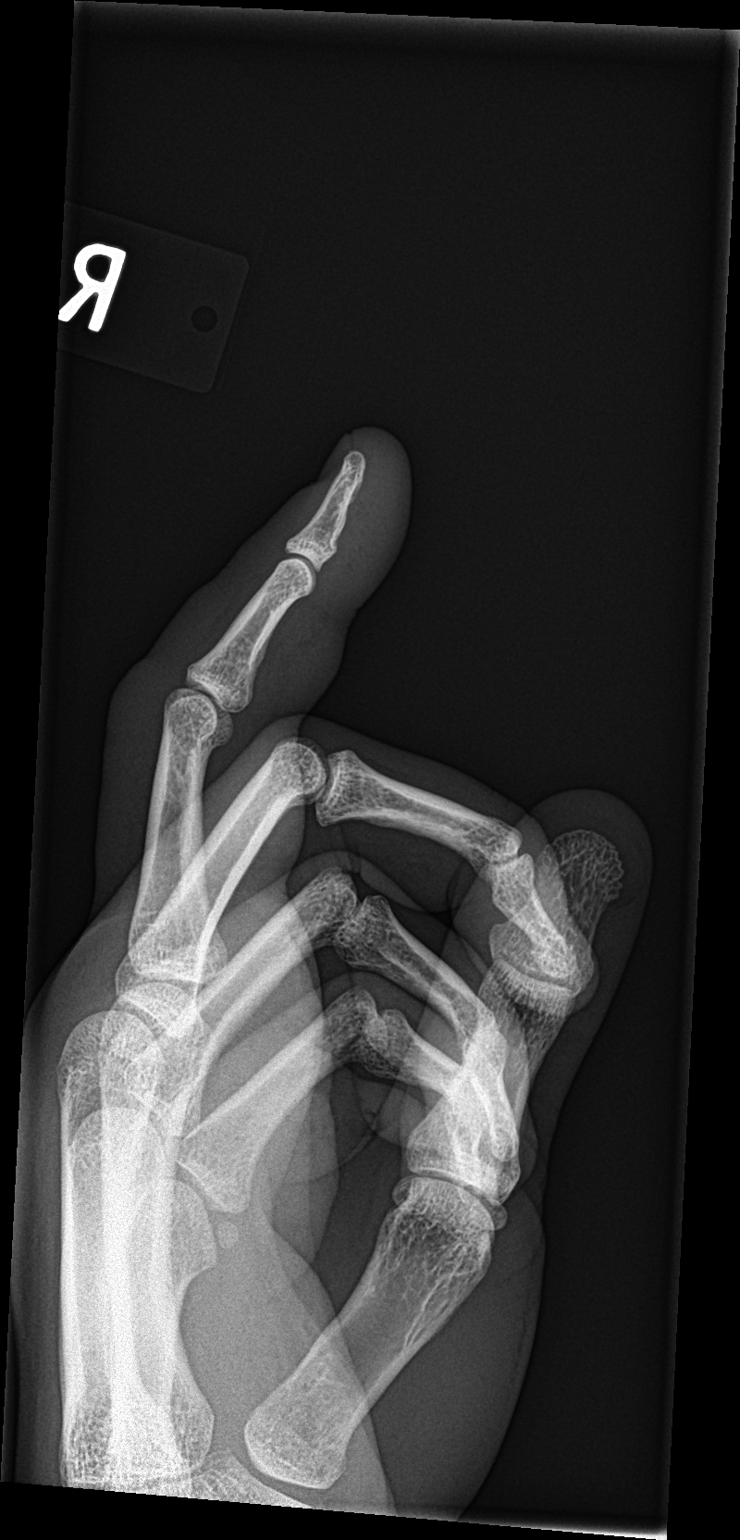

[3 of 3 positions shown; findings below may reference images not displayed]

FINDINGS: Soft tissue swelling in the right index finger. No fracture,
subluxation or dislocation. No radiopaque foreign bodies. Joint
spaces maintained.
IMPRESSION: Soft tissue swelling in the right index finger. No acute bony
abnormality.

## 2020-06-02 ENCOUNTER — Other Ambulatory Visit: Payer: Self-pay

## 2020-06-02 DIAGNOSIS — Z20822 Contact with and (suspected) exposure to covid-19: Secondary | ICD-10-CM

## 2020-06-03 LAB — NOVEL CORONAVIRUS, NAA: SARS-CoV-2, NAA: NOT DETECTED

## 2020-06-03 LAB — SARS-COV-2, NAA 2 DAY TAT

## 2020-06-04 ENCOUNTER — Encounter: Payer: Self-pay | Admitting: Family Medicine

## 2020-06-04 ENCOUNTER — Ambulatory Visit (INDEPENDENT_AMBULATORY_CARE_PROVIDER_SITE_OTHER): Payer: Self-pay | Admitting: Family Medicine

## 2020-06-04 DIAGNOSIS — J40 Bronchitis, not specified as acute or chronic: Secondary | ICD-10-CM

## 2020-06-04 DIAGNOSIS — J329 Chronic sinusitis, unspecified: Secondary | ICD-10-CM

## 2020-06-04 MED ORDER — AZITHROMYCIN 250 MG PO TABS: ORAL_TABLET | ORAL | 0 refills | Status: AC

## 2020-06-04 MED ORDER — CHERATUSSIN AC 100-10 MG/5ML PO SOLN: 5.0000 mL | ORAL | 0 refills | Status: AC | PRN

## 2020-06-04 NOTE — Progress Notes (Signed)
Subjective:    Patient ID: Richard Shepard, male    DOB: Apr 28, 1991, 29 y.o.   MRN: [id number]   HPI: Richard Shepard is a 29 y.o. male presenting for five days of fever then 2-3 days of cough. Chest pain also.Severe with cough. Breathing feels like a weight on his chest. Throat hurts from cough. HA & ST at first. Continues to get worse. Not sleeping well. Feels exhausted.    Depression screen [facility] 2/9 07/27/2018 07/24/2018 12/11/2016  Decreased Interest 0 0 0  Down, Depressed, Hopeless 0 0 0  PHQ - 2 Score 0 0 0     Relevant past medical, surgical, family and social history reviewed and updated as indicated.  Interim medical history since our last visit reviewed. Allergies and medications reviewed and updated.  ROS:  Review of Systems  Constitutional: Positive for fatigue. Negative for activity change, appetite change and fever.  HENT: Positive for rhinorrhea and sore throat. Negative for ear pain, postnasal drip, sinus pressure and sneezing.   Respiratory: Positive for chest tightness. Negative for shortness of breath.   Cardiovascular: Negative for chest pain and palpitations.  Skin: Negative for rash.     Social History   Tobacco Use  Smoking Status Never Smoker  Smokeless Tobacco Never Used       Objective:     Wt Readings from Last 3 Encounters:  07/27/18 194 lb 3.2 oz (88.1 kg)  07/24/18 194 lb (88 kg)  12/11/16 170 lb 3.2 oz (77.2 kg)     Exam deferred. Pt. Harboring due to COVID 19. Phone visit performed.   Assessment & Plan:   1. Sinobronchitis     Meds ordered this encounter  Medications  . azithromycin (ZITHROMAX Z-PAK) 250 MG tablet    Sig: Take two right away Then one a day for the next 4 days.    Dispense:  6 each    Refill:  0  . guaiFENesin-codeine (CHERATUSSIN AC) 100-10 MG/5ML syrup    Sig: Take 5 mLs by mouth every 4 (four) hours as needed for cough.    Dispense:  180 mL    Refill:  0    No orders of the defined types were placed in  this encounter.     Diagnoses and all orders for this visit:  Sinobronchitis  Other orders -     azithromycin (ZITHROMAX Z-PAK) 250 MG tablet; Take two right away Then one a day for the next 4 days. -     guaiFENesin-codeine (CHERATUSSIN AC) 100-10 MG/5ML syrup; Take 5 mLs by mouth every 4 (four) hours as needed for cough.    Virtual Visit via telephone Note  I discussed the limitations, risks, security and privacy concerns of performing an evaluation and management service by telephone and the availability of in person appointments. The patient was identified with two identifiers. Pt.expressed understanding and agreed to proceed. Pt. Is at home. Dr. Darlyn Read is in his office.  Follow Up Instructions:   I discussed the assessment and treatment plan with the patient. The patient was provided an opportunity to ask questions and all were answered. The patient agreed with the plan and demonstrated an understanding of the instructions.   The patient was advised to call back or seek an in-person evaluation if the symptoms worsen or if the condition fails to improve as anticipated.   Total minutes including chart review and phone contact time: 14   Follow up plan: Return if symptoms worsen or fail to  improve.  Mechele Claude, MD [facility]
